# Patient Record
Sex: Male | Born: 1955 | Race: White | Hispanic: No | Marital: Married | Smoking: Former smoker
Health system: Southern US, Community
[De-identification: ages and names within clinical notes are randomized; demographics above are authoritative.]

## PROBLEM LIST (undated history)

## (undated) DIAGNOSIS — K219 Gastro-esophageal reflux disease without esophagitis: Secondary | ICD-10-CM

## (undated) DIAGNOSIS — Z79890 Hormone replacement therapy (postmenopausal): Secondary | ICD-10-CM

## (undated) DIAGNOSIS — E785 Hyperlipidemia, unspecified: Secondary | ICD-10-CM

## (undated) DIAGNOSIS — E079 Disorder of thyroid, unspecified: Secondary | ICD-10-CM

## (undated) HISTORY — DX: Gastro-esophageal reflux disease without esophagitis: K21.9

## (undated) HISTORY — DX: Hyperlipidemia, unspecified: E78.5

## (undated) HISTORY — DX: Hormone replacement therapy: Z79.890

## (undated) HISTORY — DX: Disorder of thyroid, unspecified: E07.9

## (undated) HISTORY — PX: LAPAROSCOPIC GASTRIC BANDING: SHX1100

---

## 2004-01-31 ENCOUNTER — Ambulatory Visit: Payer: Self-pay | Admitting: Family Medicine

## 2005-11-02 ENCOUNTER — Ambulatory Visit: Payer: Self-pay | Admitting: Family Medicine

## 2007-01-01 IMAGING — CR CERVICAL SPINE - COMPLETE 4+ VIEW
1 series · 7 of 7 positions shown · non-contrast
Comparison: none

REASON FOR EXAM: NECK PAIN
COMMENTS:

[Series 1: view not recorded · 0.17mm/px · 7 of 7 slices shown]
[im 1/7]
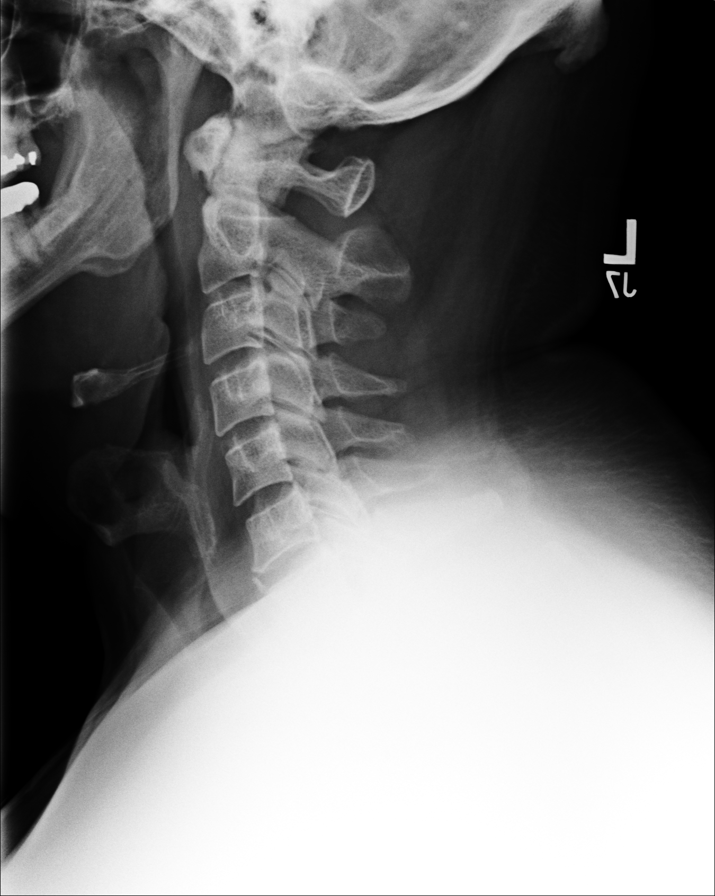
[im 2/7]
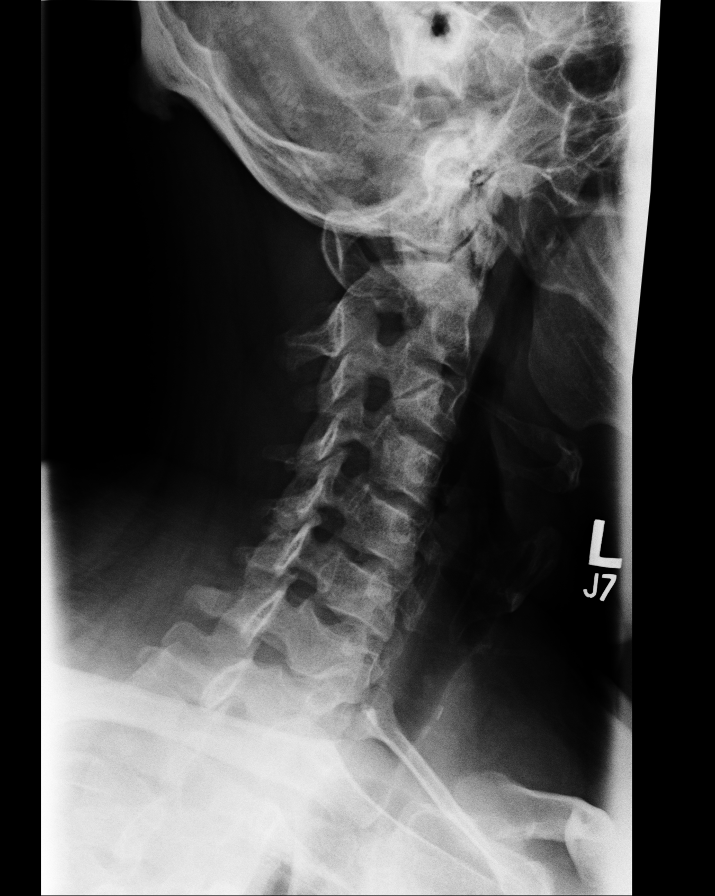
[im 3/7]
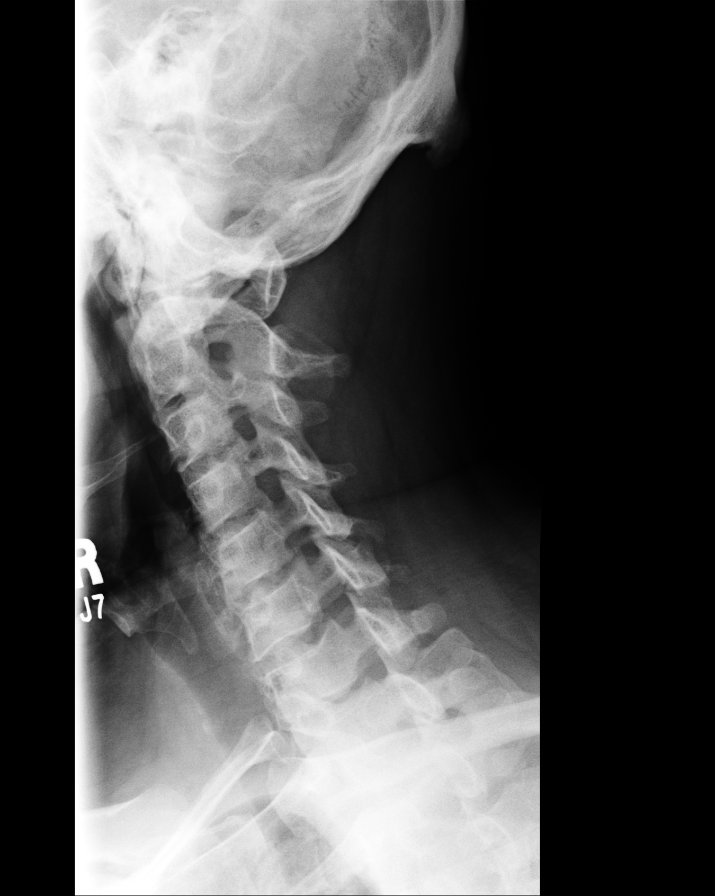
[im 4/7]
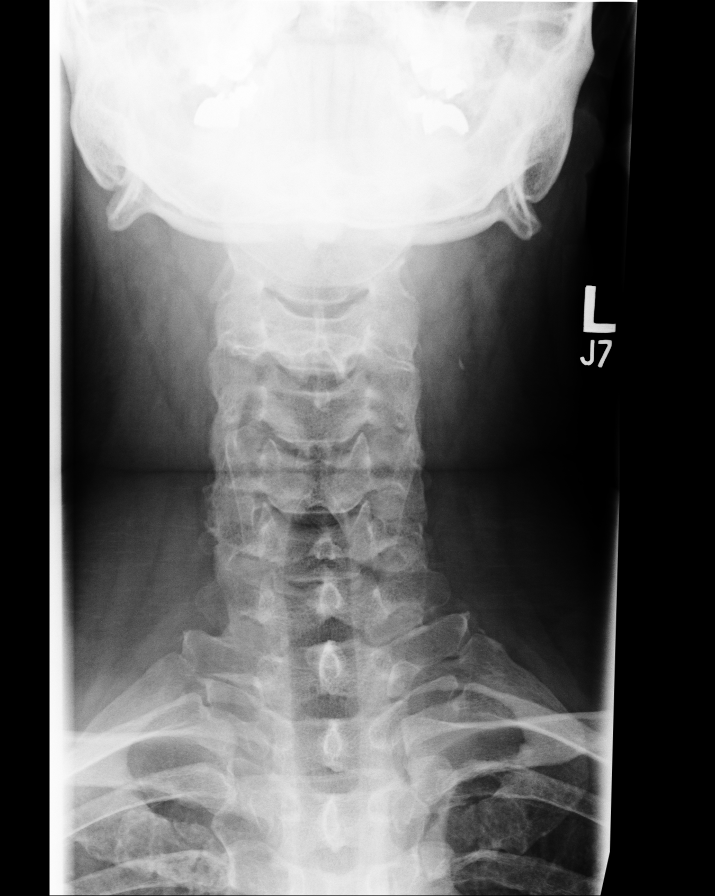
[im 5/7]
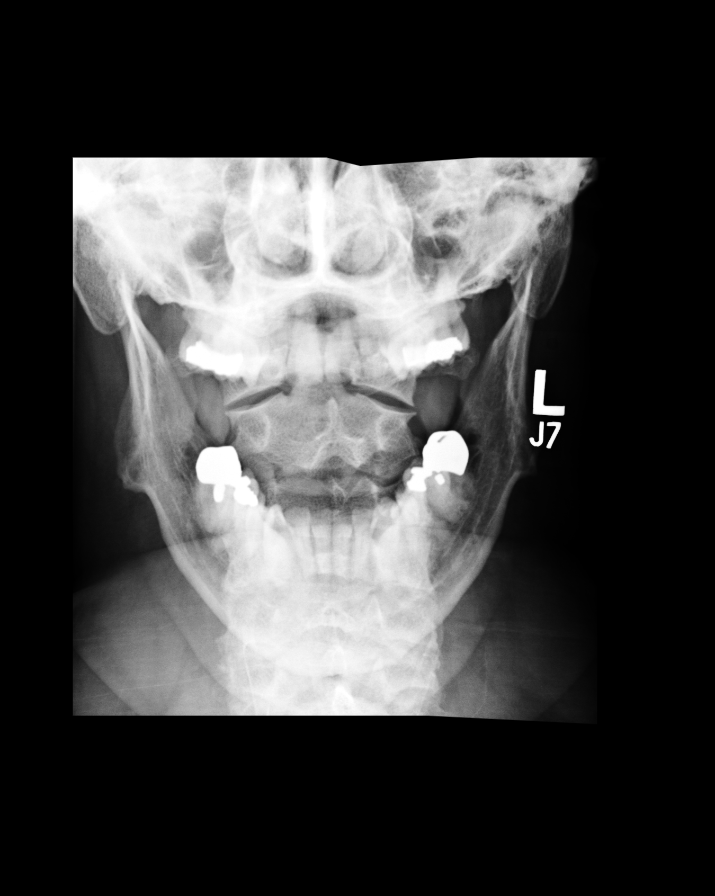
[im 6/7]
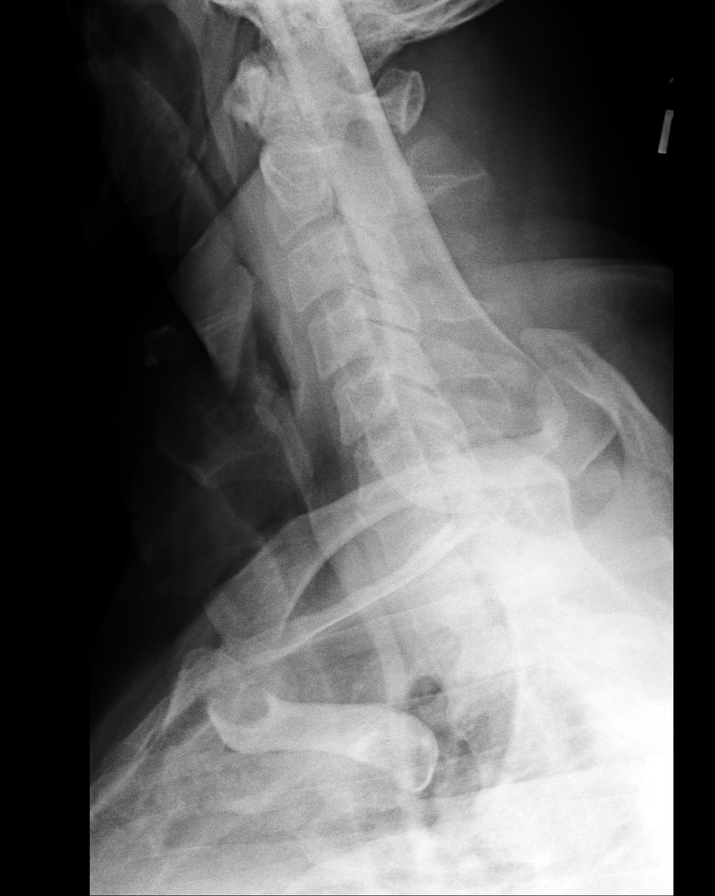
[im 7/7]
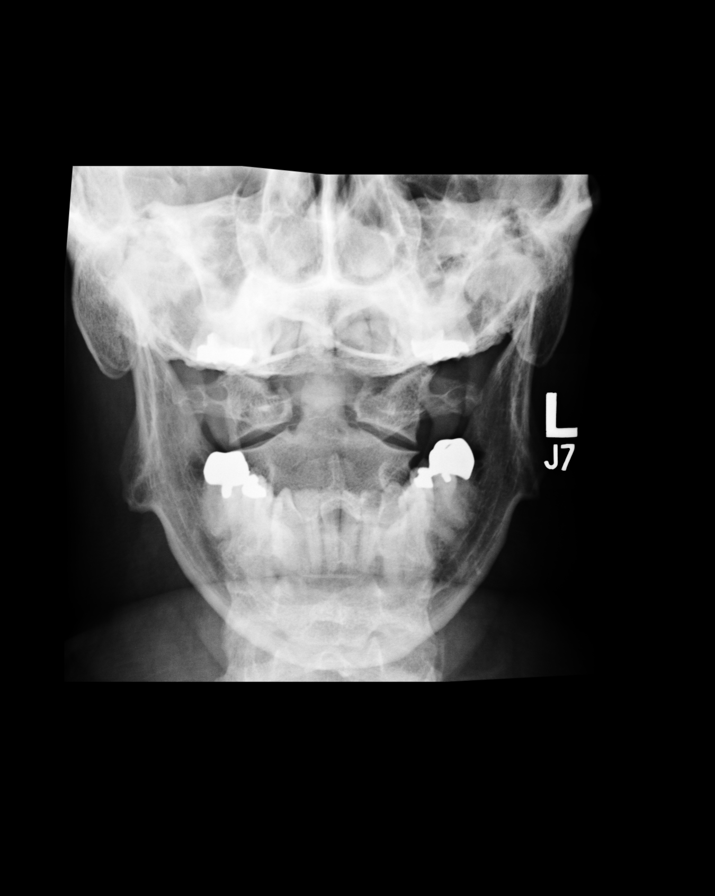

[7 of 7 positions shown; findings below may reference images not displayed]

PROCEDURE:     DXR - DXR CERVICAL SPINE COMPLETE  - November 02, 2005 [DATE]

RESULT:       AP, lateral and oblique views of the cervical spine show the
vertebral body heights and intervertebral disc spaces to be well maintained.
 The vertebral body alignment is normal.  The odontoid process is intact.
IMPRESSION: No significant abnormalities are identified.

## 2007-09-11 ENCOUNTER — Ambulatory Visit: Payer: Self-pay | Admitting: Gastroenterology

## 2009-04-05 HISTORY — PX: SEPTOPLASTY: SUR1290

## 2009-04-05 LAB — HM COLONOSCOPY

## 2009-12-31 ENCOUNTER — Ambulatory Visit: Payer: Self-pay | Admitting: Family Medicine

## 2010-07-22 ENCOUNTER — Ambulatory Visit: Payer: Self-pay | Admitting: Family Medicine

## 2011-11-03 ENCOUNTER — Ambulatory Visit: Payer: Self-pay | Admitting: Family Medicine

## 2014-01-24 LAB — LIPID PANEL
Cholesterol: 176 mg/dL (ref 0–200)
HDL: 47 mg/dL (ref 35–70)
LDL Cholesterol: 105 mg/dL
Triglycerides: 118 mg/dL (ref 40–160)

## 2014-01-24 LAB — BASIC METABOLIC PANEL
CREATININE: 0.8 mg/dL (ref <=1.3)
Glucose: 96 mg/dL

## 2014-01-24 LAB — PSA: PSA: 0.1

## 2014-01-24 LAB — FECAL OCCULT BLOOD, GUAIAC: Fecal Occult Blood: NEGATIVE

## 2014-01-24 LAB — TSH: TSH: 1.7 u[IU]/mL (ref <=5.90)

## 2014-08-08 DIAGNOSIS — E7849 Other hyperlipidemia: Secondary | ICD-10-CM | POA: Insufficient documentation

## 2014-08-08 DIAGNOSIS — N62 Hypertrophy of breast: Secondary | ICD-10-CM | POA: Insufficient documentation

## 2014-08-08 DIAGNOSIS — N429 Disorder of prostate, unspecified: Secondary | ICD-10-CM | POA: Insufficient documentation

## 2014-08-08 DIAGNOSIS — Z79890 Hormone replacement therapy (postmenopausal): Secondary | ICD-10-CM | POA: Insufficient documentation

## 2014-08-08 DIAGNOSIS — K219 Gastro-esophageal reflux disease without esophagitis: Secondary | ICD-10-CM | POA: Insufficient documentation

## 2014-08-08 DIAGNOSIS — I519 Heart disease, unspecified: Secondary | ICD-10-CM | POA: Insufficient documentation

## 2014-08-08 DIAGNOSIS — R69 Illness, unspecified: Secondary | ICD-10-CM | POA: Insufficient documentation

## 2014-08-08 DIAGNOSIS — E039 Hypothyroidism, unspecified: Secondary | ICD-10-CM | POA: Insufficient documentation

## 2015-02-06 ENCOUNTER — Other Ambulatory Visit: Payer: Self-pay | Admitting: Family Medicine

## 2015-02-15 ENCOUNTER — Other Ambulatory Visit: Payer: Self-pay | Admitting: Family Medicine

## 2015-03-04 ENCOUNTER — Other Ambulatory Visit: Payer: Self-pay | Admitting: Family Medicine

## 2015-05-11 ENCOUNTER — Other Ambulatory Visit: Payer: Self-pay | Admitting: Family Medicine

## 2015-05-29 ENCOUNTER — Other Ambulatory Visit: Payer: Self-pay | Admitting: Family Medicine

## 2015-06-27 ENCOUNTER — Ambulatory Visit: Payer: Self-pay | Admitting: Family Medicine

## 2015-07-04 ENCOUNTER — Encounter: Payer: Self-pay | Admitting: Family Medicine

## 2015-07-04 ENCOUNTER — Ambulatory Visit (INDEPENDENT_AMBULATORY_CARE_PROVIDER_SITE_OTHER): Payer: BLUE CROSS/BLUE SHIELD | Admitting: Family Medicine

## 2015-07-04 VITALS — BP 120/60 | HR 72 | Ht 71.0 in | Wt 280.0 lb

## 2015-07-04 DIAGNOSIS — K219 Gastro-esophageal reflux disease without esophagitis: Secondary | ICD-10-CM

## 2015-07-04 DIAGNOSIS — N401 Enlarged prostate with lower urinary tract symptoms: Secondary | ICD-10-CM

## 2015-07-04 DIAGNOSIS — Z79890 Hormone replacement therapy (postmenopausal): Secondary | ICD-10-CM | POA: Diagnosis not present

## 2015-07-04 DIAGNOSIS — R351 Nocturia: Secondary | ICD-10-CM | POA: Diagnosis not present

## 2015-07-04 DIAGNOSIS — E784 Other hyperlipidemia: Secondary | ICD-10-CM

## 2015-07-04 DIAGNOSIS — E039 Hypothyroidism, unspecified: Secondary | ICD-10-CM | POA: Diagnosis not present

## 2015-07-04 DIAGNOSIS — E7849 Other hyperlipidemia: Secondary | ICD-10-CM

## 2015-07-04 MED ORDER — LEVOTHYROXINE SODIUM 175 MCG PO TABS: 175.0000 ug | ORAL_TABLET | Freq: Every day | ORAL | Status: AC

## 2015-07-04 MED ORDER — ATORVASTATIN CALCIUM 20 MG PO TABS: 20.0000 mg | ORAL_TABLET | Freq: Every evening | ORAL | Status: AC

## 2015-07-04 MED ORDER — FINASTERIDE 5 MG PO TABS: 5.0000 mg | ORAL_TABLET | Freq: Every day | ORAL | Status: AC

## 2015-07-04 MED ORDER — ESTRADIOL 2 MG PO TABS: 2.0000 mg | ORAL_TABLET | Freq: Every morning | ORAL | Status: AC

## 2015-07-04 MED ORDER — LANSOPRAZOLE 30 MG PO CPDR: 30.0000 mg | DELAYED_RELEASE_CAPSULE | Freq: Every day | ORAL | Status: AC

## 2015-07-04 NOTE — Progress Notes (Signed)
Name: Glenn Reynolds   MRN: [id number]    DOB: 10/02/1955   Date:07/04/2015       Progress Note  Subjective  Chief Complaint  Chief Complaint  Patient presents with  . Hypothyroidism  . Hyperlipidemia  . Gastroesophageal Reflux  . hormone replacement therapy    Hyperlipidemia This is a chronic problem. The current episode started more than 1 year ago. The problem is controlled. Recent lipid tests were reviewed and are normal. He has no history of chronic renal disease, diabetes, hypothyroidism, liver disease, obesity or nephrotic syndrome. There are no known factors aggravating his hyperlipidemia. Pertinent negatives include no chest pain, focal sensory loss, focal weakness, leg pain, myalgias or shortness of breath. He is currently on no antihyperlipidemic treatment. The current treatment provides moderate improvement of lipids. There are no compliance problems.  Risk factors for coronary artery disease include hypertension, dyslipidemia and post-menopausal.  Gastroesophageal Reflux He complains of heartburn. He reports no abdominal pain, no belching, no chest pain, no choking, no coughing, no dysphagia, no nausea, no sore throat or no wheezing. This is a recurrent problem. The problem occurs occasionally. The problem has been gradually improving. The symptoms are aggravated by certain foods and lying down. Pertinent negatives include no anemia, fatigue, melena, muscle weakness, orthopnea or weight loss. He has tried an antacid and a PPI for the symptoms. The treatment provided mild relief.  Thyroid Problem Presents for follow-up visit. Patient reports no anxiety, cold intolerance, constipation, depressed mood, diaphoresis, diarrhea, fatigue, palpitations or weight loss. The symptoms have been stable. The treatment provided moderate relief. His past medical history is significant for hyperlipidemia. There is no history of atrial fibrillation, diabetes or neuropathy.  Urinary Frequency  This is a  recurrent problem. The current episode started more than 1 year ago. The problem occurs intermittently. The problem has been waxing and waning. The patient is experiencing no pain. Associated symptoms include frequency and hesitancy. Pertinent negatives include no chills, discharge, flank pain, hematuria, nausea or urgency. The treatment provided mild relief.    No problem-specific assessment & plan notes found for this encounter.   Past Medical History  Diagnosis Date  . Hyperlipidemia   . Thyroid disease   . GERD (gastroesophageal reflux disease)   . Hormone replacement therapy (HRT)     Past Surgical History  Procedure Laterality Date  . Laparoscopic gastric banding    . Septoplasty  2011    No family history on file.  Social History   Social History  . Marital Status: Married    Spouse Name: N/A  . Number of Children: N/A  . Years of Education: N/A   Occupational History  . Not on file.   Social History Main Topics  . Smoking status: Former Games developermoker  . Smokeless tobacco: Not on file  . Alcohol Use: 0.0 oz/week    0 Standard drinks or equivalent per week  . Drug Use: No  . Sexual Activity: Not on file   Other Topics Concern  . Not on file   Social History Narrative    No Known Allergies   Review of Systems  Constitutional: Negative for fever, chills, weight loss, malaise/fatigue, diaphoresis and fatigue.  HENT: Negative for ear discharge, ear pain and sore throat.   Eyes: Negative for blurred vision.  Respiratory: Negative for cough, sputum production, choking, shortness of breath and wheezing.   Cardiovascular: Negative for chest pain, palpitations and leg swelling.  Gastrointestinal: Positive for heartburn. Negative for dysphagia, nausea, abdominal pain,  diarrhea, constipation, blood in stool and melena.  Genitourinary: Positive for hesitancy and frequency. Negative for dysuria, urgency, hematuria and flank pain.  Musculoskeletal: Negative for myalgias,  back pain, joint pain, muscle weakness and neck pain.  Skin: Negative for rash.  Neurological: Negative for dizziness, tingling, sensory change, focal weakness and headaches.  Endo/Heme/Allergies: Negative for environmental allergies, cold intolerance and polydipsia. Does not bruise/bleed easily.  Psychiatric/Behavioral: Negative for depression and suicidal ideas. The patient is not nervous/anxious and does not have insomnia.      Objective  Filed Vitals:   07/04/15 1052  BP: 120/60  Pulse: 72  Height:  (1.803 m)  Weight: 280 lb (127.007 kg)    Physical Exam  Constitutional: He is oriented to person, place, and time and well-developed, well-nourished, and in no distress.  HENT:  Head: Normocephalic.  Right Ear: External ear normal.  Left Ear: External ear normal.  Nose: Nose normal.  Mouth/Throat: Oropharynx is clear and moist.  Eyes: Conjunctivae and EOM are normal. Pupils are equal, round, and reactive to light. Right eye exhibits no discharge. Left eye exhibits no discharge. No scleral icterus.  Neck: Normal range of motion. Neck supple. No JVD present. No tracheal deviation present. No thyromegaly present.  Cardiovascular: Normal rate, regular rhythm, normal heart sounds and intact distal pulses.  Exam reveals no gallop and no friction rub.   No murmur heard. Pulmonary/Chest: Breath sounds normal. No respiratory distress. He has no wheezes. He has no rales. Right breast exhibits no inverted nipple, no mass, no nipple discharge, no skin change and no tenderness. Left breast exhibits no inverted nipple, no mass, no nipple discharge, no skin change and no tenderness. Breasts are symmetrical.  Abdominal: Soft. Bowel sounds are normal. He exhibits no mass. There is no hepatosplenomegaly. There is no tenderness. There is no rebound, no guarding and no CVA tenderness.  Genitourinary: Rectum normal and prostate normal.  Musculoskeletal: Normal range of motion. He exhibits no edema  or tenderness.  Lymphadenopathy:    He has no cervical adenopathy.  Neurological: He is alert and oriented to person, place, and time. He has normal sensation, normal strength, normal reflexes and intact cranial nerves. No cranial nerve deficit.  Skin: Skin is warm. No rash noted.  Psychiatric: Mood and affect normal.  Nursing note and vitals reviewed.     Assessment & Plan  Problem List Items Addressed This Visit      Digestive   Gastroesophageal reflux disease without esophagitis   Relevant Medications   lansoprazole (PREVACID) 30 MG capsule     Other   Familial multiple lipoprotein-type hyperlipidemia   Relevant Medications   atorvastatin (LIPITOR) 20 MG tablet   Other Relevant Orders   Lipid Profile   Need for prophylactic hormone replacement therapy (postmenopausal)   Relevant Medications   estradiol (ESTRACE) 2 MG tablet   Other Relevant Orders   Renal Function Panel    Other Visit Diagnoses    Hypothyroidism, unspecified hypothyroidism type    -  Primary    Relevant Medications    levothyroxine (SYNTHROID) 175 MCG tablet    Other Relevant Orders    TSH    BPH associated with nocturia        Relevant Medications    finasteride (PROSCAR) 5 MG tablet    estradiol (ESTRACE) 2 MG tablet    Other Relevant Orders    PSA         Dr. Hayden [facility]  07/04/2015    

## 2015-07-05 LAB — LIPID PANEL
CHOLESTEROL TOTAL: 158 mg/dL (ref 100–199)
Chol/HDL Ratio: 4.3 ratio units (ref 0.0–5.0)
HDL: 37 mg/dL — AB (ref >39)
LDL CALC: 58 mg/dL (ref 0–99)
Triglycerides: 315 mg/dL — ABNORMAL HIGH (ref 0–149)
VLDL CHOLESTEROL CAL: 63 mg/dL — AB (ref 5–40)

## 2015-07-05 LAB — RENAL FUNCTION PANEL
ALBUMIN: 4 g/dL (ref 3.6–4.8)
BUN/Creatinine Ratio: 24 — ABNORMAL HIGH (ref 10–22)
BUN: 15 mg/dL (ref 8–27)
CO2: 21 mmol/L (ref 18–29)
CREATININE: 0.62 mg/dL — AB (ref 0.76–1.27)
Calcium: 8.9 mg/dL (ref 8.6–10.2)
Chloride: 104 mmol/L (ref 96–106)
GFR, EST AFRICAN AMERICAN: 125 mL/min/{1.73_m2} (ref >59)
GFR, EST NON AFRICAN AMERICAN: 108 mL/min/{1.73_m2} (ref >59)
Glucose: 107 mg/dL — ABNORMAL HIGH (ref 65–99)
PHOSPHORUS: 3.5 mg/dL (ref 2.5–4.5)
Potassium: 4.4 mmol/L (ref 3.5–5.2)
Sodium: 142 mmol/L (ref 134–144)

## 2015-07-05 LAB — PSA: Prostate Specific Ag, Serum: <0.1 ng/mL (ref 0.0–4.0)

## 2015-07-05 LAB — TSH: TSH: 1.2 u[IU]/mL (ref 0.450–4.500)

## 2016-01-07 ENCOUNTER — Ambulatory Visit: Payer: BLUE CROSS/BLUE SHIELD | Admitting: Family Medicine

## 2016-07-04 ENCOUNTER — Other Ambulatory Visit: Payer: Self-pay | Admitting: Family Medicine

## 2016-07-04 DIAGNOSIS — E7849 Other hyperlipidemia: Secondary | ICD-10-CM

## 2016-07-04 DIAGNOSIS — E039 Hypothyroidism, unspecified: Secondary | ICD-10-CM

## 2016-07-16 ENCOUNTER — Other Ambulatory Visit: Payer: Self-pay | Admitting: Family Medicine

## 2016-07-16 DIAGNOSIS — E7849 Other hyperlipidemia: Secondary | ICD-10-CM

## 2016-07-16 DIAGNOSIS — E039 Hypothyroidism, unspecified: Secondary | ICD-10-CM
